# Patient Record
Sex: Female | Born: 1985 | Race: Black or African American | Hispanic: No | Marital: Single | Smoking: Never smoker
Health system: Southern US, Community
[De-identification: ages and names within clinical notes are randomized; demographics above are authoritative.]

## PROBLEM LIST (undated history)

## (undated) DIAGNOSIS — Z889 Allergy status to unspecified drugs, medicaments and biological substances status: Secondary | ICD-10-CM

---
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
## 2014-06-21 ENCOUNTER — Emergency Department (HOSPITAL_BASED_OUTPATIENT_CLINIC_OR_DEPARTMENT_OTHER)
Admission: EM | Admit: 2014-06-21 | Discharge: 2014-06-22 | Disposition: A | Discharge status: Home / Self Care | Payer: Medicaid Other | Attending: Emergency Medicine | Admitting: Emergency Medicine

## 2014-06-21 ENCOUNTER — Encounter (HOSPITAL_BASED_OUTPATIENT_CLINIC_OR_DEPARTMENT_OTHER): Payer: Self-pay | Admitting: Emergency Medicine

## 2014-06-21 DIAGNOSIS — K529 Noninfective gastroenteritis and colitis, unspecified: Secondary | ICD-10-CM

## 2014-06-21 DIAGNOSIS — R112 Nausea with vomiting, unspecified: Secondary | ICD-10-CM | POA: Diagnosis present

## 2014-06-21 LAB — CBC WITH DIFFERENTIAL/PLATELET
BASOS ABS: 0 10*3/uL (ref 0.0–0.1)
Basophils Relative: 0 % (ref 0–1)
EOS ABS: 0.3 10*3/uL (ref 0.0–0.7)
EOS PCT: 4 % (ref 0–5)
HEMATOCRIT: 35.4 % — AB (ref 36.0–46.0)
Hemoglobin: 12.4 g/dL (ref 12.0–15.0)
Lymphocytes Relative: 31 % (ref 12–46)
Lymphs Abs: 2.6 10*3/uL (ref 0.7–4.0)
MCH: 27 pg (ref 26.0–34.0)
MCHC: 35 g/dL (ref 30.0–36.0)
MCV: 77.1 fL — ABNORMAL LOW (ref 78.0–100.0)
Monocytes Absolute: 0.8 10*3/uL (ref 0.1–1.0)
Monocytes Relative: 9 % (ref 3–12)
Neutro Abs: 4.8 10*3/uL (ref 1.7–7.7)
Neutrophils Relative %: 56 % (ref 43–77)
Platelets: 292 10*3/uL (ref 150–400)
RBC: 4.59 MIL/uL (ref 3.87–5.11)
RDW: 14.2 % (ref 11.5–15.5)
WBC: 8.5 10*3/uL (ref 4.0–10.5)

## 2014-06-21 LAB — BASIC METABOLIC PANEL
Anion gap: 5 (ref 5–15)
BUN: 14 mg/dL (ref 6–23)
CALCIUM: 9.2 mg/dL (ref 8.4–10.5)
CO2: 26 mmol/L (ref 19–32)
Chloride: 107 mEq/L (ref 96–112)
Creatinine, Ser: 0.64 mg/dL (ref 0.50–1.10)
GFR calc Af Amer: >90 mL/min (ref >90)
GFR calc non Af Amer: >90 mL/min (ref >90)
GLUCOSE: 95 mg/dL (ref 70–99)
Potassium: 3.8 mmol/L (ref 3.5–5.1)
Sodium: 138 mmol/L (ref 135–145)

## 2014-06-21 MED ORDER — ONDANSETRON 4 MG PO TBDP
ORAL_TABLET | ORAL | Status: AC
  Administered 2014-06-21: 21:00:00
  Filled 2014-06-21: qty 1

## 2014-06-21 MED ORDER — ONDANSETRON 8 MG PO TBDP: ORAL_TABLET | ORAL | Status: DC

## 2014-06-21 MED ORDER — SODIUM CHLORIDE 0.9 % IV SOLN
Freq: Once | INTRAVENOUS | Status: AC
  Administered 2014-06-21: 10 mL/h via INTRAVENOUS

## 2014-06-21 MED ORDER — ONDANSETRON HCL 4 MG/2ML IJ SOLN
4.0000 mg | Freq: Once | INTRAMUSCULAR | Status: AC
  Administered 2014-06-21: 4 mg via INTRAVENOUS
  Filled 2014-06-21: qty 2

## 2014-06-21 NOTE — ED Notes (Signed)
Pt alert, NAD, calm, interactive, skin W&D, resps e/u, no dyspnea noted, no emesis or vomiting noted, steady gait.

## 2014-06-21 NOTE — ED Provider Notes (Signed)
CSN: [id number]     Arrival date & time 06/21/14  2014 History  This chart was scribed for No att. providers found by Modena JanskyAlbert Bradley, ED Scribe. This patient was seen in room MH09/MH09 and the patient's care was started at 10:49 PM.   Chief Complaint  Patient presents with  . Vomiting    HPI Comments: Patient is a 28 year old female who presents with complaints of nausea, vomiting, and headache that started this morning. She denies any bloody emesis. She denies any fevers or chills. She denies any cough, chest pain, or shortness of breath. She denies any ill contacts.  The history is provided by the patient. No language interpreter was used.   HPI Comments: Casey Bradley is a 28 y.o. female who presents to the Emergency Department complaining of vomiting  History reviewed. No pertinent past medical history. History reviewed. No pertinent past surgical history. No family history on file. History  Substance Use Topics  . Smoking status: Never Smoker   . Smokeless tobacco: Not on file  . Alcohol Use: Not on file   OB History    No data available     Review of Systems A complete 10 system review of systems was obtained and all systems are negative except as noted in the HPI and PMH.   Allergies  Review of patient's allergies indicates no known allergies.  Home Medications   Prior to Admission medications   Not on File   BP 122/76 mmHg  Pulse 58  Temp(Src) 98.4 F (36.9 C) (Oral)  Resp 18  Ht 5\' 5"  (1.651 m)  Wt 150 lb (68.04 kg)  BMI 24.96 kg/m2  SpO2 99%  LMP 06/16/2014 Physical Exam  Nursing note and vitals reviewed.  General: Well-developed, well-nourished female in no acute distress; appearance consistent with age of record HENT: normocephalic; atraumatic Eyes: pupils equal, round and reactive to light; extraocular muscles intact Neck: supple Heart: regular rate and rhythm; no murmurs, rubs or gallops Lungs: clear to auscultation bilaterally Abdomen: soft;  nondistended; nontender; no masses or hepatosplenomegaly; bowel sounds present Extremities: No deformity; full range of motion; pulses normal Neurologic: Awake, alert and oriented; motor function intact in all extremities and symmetric; no facial droop Skin: Warm and dry Psychiatric: Normal mood and affect  ED Course  Procedures (including critical care time) DIAGNOSTIC STUDIES: Oxygen Saturation is 99% on RA, normal by my interpretation.    COORDINATION OF CARE: 10:49 PM- Pt advised of plan for treatment and pt agrees.  Labs Review Labs Reviewed  CBC WITH DIFFERENTIAL - Abnormal; Notable for the following:    HCT 35.4 (*)    MCV 77.1 (*)    All other components within normal limits  BASIC METABOLIC PANEL    Imaging Review No results found.   EKG Interpretation None      MDM   Final diagnoses:  None    Her presentation, exam, and workup are consistent with a viral gastroenteritis. She is given anti-medics and fluids in the ER and is feeling better. She will be discharged to home, to return as needed for any problems.  I personally performed the services described in this documentation, which was scribed in my presence. The recorded information has been reviewed and is accurate.       Casey Lyonsouglas Mirielle Byrum, MD 06/22/14 [phone]

## 2014-06-21 NOTE — ED Notes (Signed)
Pt c/o "can taste cold", also reports nv & HA, no relief with ODT zofran in triage, (denies: fever, sob, cough, diarrhea, sore throat or dizziness), nv onset this morning, have also developed HA.

## 2014-06-21 NOTE — Discharge Instructions (Signed)
Zofran as prescribed as needed for nausea.  Follow-up with your primary Dr. if not improving in the next 2-3 days, and return to the ER if your symptoms substantially worsen or change.   Viral Gastroenteritis Viral gastroenteritis is also known as stomach flu. This condition affects the stomach and intestinal tract. It can cause sudden diarrhea and vomiting. The illness typically lasts 3 to 8 days. Most people develop an immune response that eventually gets rid of the virus. While this natural response develops, the virus can make you quite ill. CAUSES  Many different viruses can cause gastroenteritis, such as rotavirus or noroviruses. You can catch one of these viruses by consuming contaminated food or water. You may also catch a virus by sharing utensils or other personal items with an infected person or by touching a contaminated surface. SYMPTOMS  The most common symptoms are diarrhea and vomiting. These problems can cause a severe loss of body fluids (dehydration) and a body salt (electrolyte) imbalance. Other symptoms may include:  Fever.  Headache.  Fatigue.  Abdominal pain. DIAGNOSIS  Your caregiver can usually diagnose viral gastroenteritis based on your symptoms and a physical exam. A stool sample may also be taken to test for the presence of viruses or other infections. TREATMENT  This illness typically goes away on its own. Treatments are aimed at rehydration. The most serious cases of viral gastroenteritis involve vomiting so severely that you are not able to keep fluids down. In these cases, fluids must be given through an intravenous line (IV). HOME CARE INSTRUCTIONS   Drink enough fluids to keep your urine clear or pale yellow. Drink small amounts of fluids frequently and increase the amounts as tolerated.  Ask your caregiver for specific rehydration instructions.  Avoid:  Foods high in sugar.  Alcohol.  Carbonated drinks.  Tobacco.  Juice.  Caffeine  drinks.  Extremely hot or cold fluids.  Fatty, greasy foods.  Too much intake of anything at one time.  Dairy products until 24 to 48 hours after diarrhea stops.  You may consume probiotics. Probiotics are active cultures of beneficial bacteria. They may lessen the amount and number of diarrheal stools in adults. Probiotics can be found in yogurt with active cultures and in supplements.  Wash your hands well to avoid spreading the virus.  Only take over-the-counter or prescription medicines for pain, discomfort, or fever as directed by your caregiver. Do not give aspirin to children. Antidiarrheal medicines are not recommended.  Ask your caregiver if you should continue to take your regular prescribed and over-the-counter medicines.  Keep all follow-up appointments as directed by your caregiver. SEEK IMMEDIATE MEDICAL CARE IF:   You are unable to keep fluids down.  You do not urinate at least once every 6 to 8 hours.  You develop shortness of breath.  You notice blood in your stool or vomit. This may look like coffee grounds.  You have abdominal pain that increases or is concentrated in one small area (localized).  You have persistent vomiting or diarrhea.  You have a fever.  The patient is a child younger than 3 months, and he or she has a fever.  The patient is a child older than 3 months, and he or she has a fever and persistent symptoms.  The patient is a child older than 3 months, and he or she has a fever and symptoms suddenly get worse.  The patient is a baby, and he or she has no tears when crying. MAKE SURE  YOU:   Understand these instructions.  Will watch your condition.  Will get help right away if you are not doing well or get worse. Document Released: 06/13/2005 Document Revised: 09/05/2011 Document Reviewed: 03/30/2011 [facility] Patient Information 2015 [place], [place]. This information is not intended to replace advice given to you by your health care  provider. Make sure you discuss any questions you have with your health care provider.

## 2014-06-21 NOTE — ED Notes (Signed)
Pt presents to ED with complaints of vomiting that started today. No vomiting in triage.

## 2014-06-22 NOTE — ED Notes (Signed)
I removed i.v. Per nurse request. Patient wants a "doctor note" stated she "left work to come here, needs note".

## 2015-09-07 ENCOUNTER — Emergency Department (HOSPITAL_BASED_OUTPATIENT_CLINIC_OR_DEPARTMENT_OTHER)
Admission: EM | Admit: 2015-09-07 | Discharge: 2015-09-07 | Disposition: A | Discharge status: Home / Self Care | Payer: Medicaid Other | Attending: Emergency Medicine | Admitting: Emergency Medicine

## 2015-09-07 ENCOUNTER — Encounter (HOSPITAL_BASED_OUTPATIENT_CLINIC_OR_DEPARTMENT_OTHER): Payer: Self-pay | Admitting: *Deleted

## 2015-09-07 DIAGNOSIS — R51 Headache: Secondary | ICD-10-CM | POA: Diagnosis present

## 2015-09-07 DIAGNOSIS — J0100 Acute maxillary sinusitis, unspecified: Secondary | ICD-10-CM | POA: Diagnosis not present

## 2015-09-07 HISTORY — DX: Allergy status to unspecified drugs, medicaments and biological substances: Z88.9

## 2015-09-07 MED ORDER — AZITHROMYCIN 250 MG PO TABS: 250.0000 mg | ORAL_TABLET | Freq: Every day | ORAL | Status: DC

## 2015-09-07 NOTE — ED Notes (Signed)
One week of facial pain, sneezing, congestion, denies fever

## 2015-09-07 NOTE — ED Provider Notes (Signed)
CSN: [id number]     Arrival date & time 09/07/15  1051 History   First MD Initiated Contact with Patient 09/07/15 1311     Chief Complaint  Patient presents with  . Facial Pain     (Consider location/radiation/quality/duration/timing/severity/associated sxs/prior Treatment) HPI Comments: Patient presents today with facial pain.  Pain located over the maxillary sinuses.  Pain worse with leaning forward.  Pain has been present for the past week and is gradually worsening.  She reports that the pain feels similar to pain that she has had with Acute Sinusitis in the past.  She has not taken anything for symptoms prior to arrival.  Pain associated with nasal congestion.  She denies fever, chills, cough, SOB, ear pain, or sore throat.    The history is provided by the patient.    Past Medical History  Diagnosis Date  . Multiple allergies    History reviewed. No pertinent past surgical history. No family history on file. Social History  Substance Use Topics  . Smoking status: Never Smoker   . Smokeless tobacco: Never Used  . Alcohol Use: Yes     Comment: occasional   OB History    No data available     Review of Systems  All other systems reviewed and are negative.     Allergies  Review of patient's allergies indicates no known allergies.  Home Medications   Prior to Admission medications   Medication Sig Start Date End Date Taking? Authorizing Provider  ondansetron (ZOFRAN ODT) 8 MG disintegrating tablet 8mg  ODT q4 hours prn nausea 06/21/14   Geoffery Lyonsouglas Delo, MD   BP 119/92 mmHg  Pulse 93  Temp(Src) 98.6 F (37 C) (Oral)  Resp 18  Ht 5\' 5"  (1.651 m)  Wt 71.169 kg  BMI 26.11 kg/m2  SpO2 100%  LMP 08/26/2015 Physical Exam  Constitutional: She appears well-developed and well-nourished.  HENT:  Head: Normocephalic and atraumatic.  Right Ear: Tympanic membrane and ear canal normal.  Left Ear: Tympanic membrane and ear canal normal.  Nose: Right sinus exhibits  maxillary sinus tenderness. Left sinus exhibits maxillary sinus tenderness.  Neck: Normal range of motion. Neck supple.  Cardiovascular: Normal rate, regular rhythm and normal heart sounds.   Pulmonary/Chest: Effort normal and breath sounds normal. No respiratory distress. She has no wheezes. She has no rales.  Musculoskeletal: Normal range of motion.  Neurological: She is alert.  Skin: Skin is warm and dry.  Psychiatric: She has a normal mood and affect.  Nursing note and vitals reviewed.   ED Course  Procedures (including critical care time) Labs Review Labs Reviewed - No data to display  Imaging Review No results found. I have personally reviewed and evaluated these images and lab results as part of my medical decision-making.   EKG Interpretation None      MDM   Final diagnoses:  None   Patient with symptoms consistent with Acute Sinusitis.  Patient given rx for antibiotics.  Stable for discharge.  Return precautions given.    Santiago GladHeather Adasha Boehme, PA-C 09/07/15 1631  Geoffery Lyonsouglas Delo, MD 09/08/15 [phone]

## 2019-03-14 ENCOUNTER — Encounter (HOSPITAL_BASED_OUTPATIENT_CLINIC_OR_DEPARTMENT_OTHER): Payer: Self-pay | Admitting: *Deleted

## 2019-03-14 ENCOUNTER — Emergency Department (HOSPITAL_BASED_OUTPATIENT_CLINIC_OR_DEPARTMENT_OTHER)
Admission: EM | Admit: 2019-03-14 | Discharge: 2019-03-14 | Disposition: A | Discharge status: Home / Self Care | Payer: Medicaid Other | Attending: Emergency Medicine | Admitting: Emergency Medicine

## 2019-03-14 ENCOUNTER — Other Ambulatory Visit: Payer: Self-pay

## 2019-03-14 DIAGNOSIS — R300 Dysuria: Secondary | ICD-10-CM | POA: Diagnosis present

## 2019-03-14 DIAGNOSIS — Z91013 Allergy to seafood: Secondary | ICD-10-CM | POA: Diagnosis not present

## 2019-03-14 DIAGNOSIS — N390 Urinary tract infection, site not specified: Secondary | ICD-10-CM | POA: Diagnosis not present

## 2019-03-14 LAB — URINALYSIS, ROUTINE W REFLEX MICROSCOPIC
Bilirubin Urine: NEGATIVE
Glucose, UA: NEGATIVE mg/dL
Ketones, ur: NEGATIVE mg/dL
Nitrite: NEGATIVE
Protein, ur: 30 mg/dL — AB
Specific Gravity, Urine: 1.025 (ref 1.005–1.030)
pH: 6 (ref 5.0–8.0)

## 2019-03-14 LAB — URINALYSIS, MICROSCOPIC (REFLEX)
RBC / HPF: >50 RBC/hpf (ref 0–5)
WBC, UA: >50 WBC/hpf (ref 0–5)

## 2019-03-14 LAB — PREGNANCY, URINE: Preg Test, Ur: NEGATIVE

## 2019-03-14 MED ORDER — CEPHALEXIN 500 MG PO CAPS: 500.0000 mg | ORAL_CAPSULE | Freq: Four times a day (QID) | ORAL | 0 refills | Status: AC

## 2019-03-14 MED ORDER — PHENAZOPYRIDINE HCL 200 MG PO TABS: 200.0000 mg | ORAL_TABLET | Freq: Three times a day (TID) | ORAL | 0 refills | Status: AC | PRN

## 2019-03-14 NOTE — Discharge Instructions (Signed)
Stay very well hydrated with plenty of water throughout the day. Please take antibiotic until completion. Use pyridium as directed to decrease painful urination but know that a common side effect is to turn your urine a bright orange/red color. This is not a harmful side effect. Follow up with primary care physician in 1 week for recheck of ongoing symptoms.  Please seek immediate care if you develop the following: Your symptoms are no better or worse in 3 days. There is severe back pain or lower abdominal pain.  You develop a fever There is nausea or vomiting.  There is continued burning or discomfort with urination after antibiotics are completed. New or worsening symptoms develop or you have any additional concerns.

## 2019-03-14 NOTE — ED Provider Notes (Signed)
[facility] EMERGENCY DEPARTMENT Provider Note   CSN: [id number] Arrival date & time: 03/14/19  1650     History   Chief Complaint Chief Complaint  Patient presents with  . Dysuria    HPI Casey Bradley is a 33 y.o. female.     The history is provided by the patient and medical records. No language interpreter was used.  Dysuria Associated symptoms: abdominal pain   Associated symptoms: no nausea, no vaginal discharge and no vomiting    Casey Bradley is a 33 y.o. female who presents to the Emergency Department complaining of dysuria and suprapubic abdominal pain which began 2 or 3 days ago.  Feels similar to previous UTIs that she has had in the past.  Denies fever, nausea, vomiting, flank pain or back pain.  No vaginal discharge.  She is sexually active, but uses protection and denies concerns for STIs.  Does not want STI testing today.  She is on her menstrual cycle.  She has tried increasing fluids as well as cranberry pills with little improvement.  Past Medical History:  Diagnosis Date  . Multiple allergies     There are no active problems to display for this patient.   History reviewed. No pertinent surgical history.   OB History   No obstetric history on file.      Home Medications    Prior to Admission medications   Medication Sig Start Date End Date Taking? Authorizing Provider  cephALEXin (KEFLEX) 500 MG capsule Take 1 capsule (500 mg total) by mouth 4 (four) times daily. 03/14/19   Casey Bradley, Casey PicketJaime Pilcher, PA-C  phenazopyridine (PYRIDIUM) 200 MG tablet Take 1 tablet (200 mg total) by mouth 3 (three) times daily as needed for pain. 03/14/19   Casey Bradley, Casey PicketJaime Pilcher, PA-C    Family History No family history on file.  Social History Social History   Tobacco Use  . Smoking status: Never Smoker  . Smokeless tobacco: Never Used  Substance Use Topics  . Alcohol use: Yes    Comment: occasional  . Drug use: No     Allergies   Shellfish  allergy   Review of Systems Review of Systems  Gastrointestinal: Positive for abdominal pain. Negative for diarrhea, nausea and vomiting.  Genitourinary: Positive for dysuria. Negative for vaginal discharge.  All other systems reviewed and are negative.    Physical Exam Updated Vital Signs BP (!) 136/100 (BP Location: Left Arm)   Pulse 89   Temp 98.8 F (37.1 C)   Resp 16   Ht 5\' 5"  (1.651 m)   Wt 86.2 kg   LMP 03/06/2019   SpO2 100%   BMI 31.62 kg/m   Physical Exam Vitals signs and nursing note reviewed.  Constitutional:      General: She is not in acute distress.    Appearance: She is well-developed.     Comments: Well-appearing.  HENT:     Head: Normocephalic and atraumatic.  Neck:     Musculoskeletal: Neck supple.  Cardiovascular:     Rate and Rhythm: Normal rate and regular rhythm.     Heart sounds: Normal heart sounds. No murmur.  Pulmonary:     Effort: Pulmonary effort is normal. No respiratory distress.     Breath sounds: Normal breath sounds.  Abdominal:     General: There is no distension.     Palpations: Abdomen is soft.     Comments: No abdominal, flank or CVA tenderness.  Skin:    General: Skin is warm  and dry.  Neurological:     Mental Status: She is alert and oriented to person, place, and time.      ED Treatments / Results  Labs (all labs ordered are listed, but only abnormal results are displayed) Labs Reviewed  URINALYSIS, ROUTINE W REFLEX MICROSCOPIC - Abnormal; Notable for the following components:      Result Value   APPearance CLOUDY (*)    Hgb urine dipstick LARGE (*)    Protein, ur 30 (*)    Leukocytes,Ua MODERATE (*)    All other components within normal limits  URINALYSIS, MICROSCOPIC (REFLEX) - Abnormal; Notable for the following components:   Bacteria, UA FEW (*)    All other components within normal limits  PREGNANCY, URINE    EKG None  Radiology No results found.  Procedures Procedures (including critical care  time)  Medications Ordered in ED Medications - No data to display   Initial Impression / Assessment and Plan / ED Course  I have reviewed the triage vital signs and the nursing notes.  Pertinent labs & imaging results that were available during my care of the patient were reviewed by me and considered in my medical decision making (see chart for details).       Casey Bradley is a 33 y.o. female who presents to ED for dysuria and suprapubic abdominal pain which feels similar to her previous UTIs.  On exam, she is afebrile, hemodynamically stable with no abdominal, flank or CVA tenderness.  She denies vaginal discharge and declines STI testing today.  Urinalysis with moderate leukocytes and > 50 WBCs consistent with UTI.  She does have hemoglobin noted in her urine, but is on her menstrual cycle.  Will treat with Keflex and Pyridium.  PCP follow-up.  Understands reasons to return to the emergency department including fever, vomiting, worsening abdominal pain, back pain or any other concerns.  All questions answered.    Final Clinical Impressions(s) / ED Diagnoses   Final diagnoses:  Lower urinary tract infectious disease    ED Discharge Orders         Ordered    cephALEXin (KEFLEX) 500 MG capsule  4 times daily     03/14/19 1748    phenazopyridine (PYRIDIUM) 200 MG tablet  3 times daily PRN     03/14/19 1748           Casey Bradley, Casey Almond, PA-C 03/14/19 1810    Quintella Reichert, MD 03/14/19 2356

## 2019-03-14 NOTE — ED Triage Notes (Signed)
C/o dysuria x 3 days  

## 2020-02-01 ENCOUNTER — Emergency Department (HOSPITAL_BASED_OUTPATIENT_CLINIC_OR_DEPARTMENT_OTHER)
Admission: EM | Admit: 2020-02-01 | Discharge: 2020-02-02 | Disposition: A | Discharge status: Home / Self Care | Payer: Medicaid Other | Attending: Emergency Medicine | Admitting: Emergency Medicine

## 2020-02-01 ENCOUNTER — Encounter (HOSPITAL_BASED_OUTPATIENT_CLINIC_OR_DEPARTMENT_OTHER): Payer: Self-pay | Admitting: *Deleted

## 2020-02-01 ENCOUNTER — Emergency Department (HOSPITAL_BASED_OUTPATIENT_CLINIC_OR_DEPARTMENT_OTHER): Payer: Medicaid Other

## 2020-02-01 ENCOUNTER — Other Ambulatory Visit: Payer: Self-pay

## 2020-02-01 DIAGNOSIS — R109 Unspecified abdominal pain: Secondary | ICD-10-CM | POA: Diagnosis not present

## 2020-02-01 DIAGNOSIS — U071 COVID-19: Secondary | ICD-10-CM | POA: Insufficient documentation

## 2020-02-01 DIAGNOSIS — M549 Dorsalgia, unspecified: Secondary | ICD-10-CM | POA: Diagnosis present

## 2020-02-01 DIAGNOSIS — J1282 Pneumonia due to coronavirus disease 2019: Secondary | ICD-10-CM

## 2020-02-01 LAB — URINALYSIS, MICROSCOPIC (REFLEX)

## 2020-02-01 LAB — CBC WITH DIFFERENTIAL/PLATELET
Abs Immature Granulocytes: 0.33 10*3/uL — ABNORMAL HIGH (ref 0.00–0.07)
Basophils Absolute: 0 10*3/uL (ref 0.0–0.1)
Basophils Relative: 0 %
Eosinophils Absolute: 0 10*3/uL (ref 0.0–0.5)
Eosinophils Relative: 0 %
HCT: 33.7 % — ABNORMAL LOW (ref 36.0–46.0)
Hemoglobin: 11.4 g/dL — ABNORMAL LOW (ref 12.0–15.0)
Immature Granulocytes: 2 %
Lymphocytes Relative: 7 %
Lymphs Abs: 1.2 10*3/uL (ref 0.7–4.0)
MCH: 24.4 pg — ABNORMAL LOW (ref 26.0–34.0)
MCHC: 33.8 g/dL (ref 30.0–36.0)
MCV: 72.2 fL — ABNORMAL LOW (ref 80.0–100.0)
Monocytes Absolute: 1.4 10*3/uL — ABNORMAL HIGH (ref 0.1–1.0)
Monocytes Relative: 9 %
Neutro Abs: 13.6 10*3/uL — ABNORMAL HIGH (ref 1.7–7.7)
Neutrophils Relative %: 82 %
Platelets: 469 10*3/uL — ABNORMAL HIGH (ref 150–400)
RBC: 4.67 MIL/uL (ref 3.87–5.11)
RDW: 16.9 % — ABNORMAL HIGH (ref 11.5–15.5)
WBC: 16.6 10*3/uL — ABNORMAL HIGH (ref 4.0–10.5)
nRBC: 0 % (ref 0.0–0.2)

## 2020-02-01 LAB — URINALYSIS, ROUTINE W REFLEX MICROSCOPIC
Glucose, UA: NEGATIVE mg/dL
Hgb urine dipstick: NEGATIVE
Ketones, ur: NEGATIVE mg/dL
Leukocytes,Ua: NEGATIVE
Nitrite: NEGATIVE
Protein, ur: 100 mg/dL — AB
Specific Gravity, Urine: >1.03 — ABNORMAL HIGH (ref 1.005–1.030)
pH: 5.5 (ref 5.0–8.0)

## 2020-02-01 LAB — COMPREHENSIVE METABOLIC PANEL
ALT: 43 U/L (ref 0–44)
AST: 30 U/L (ref 15–41)
Albumin: 2.7 g/dL — ABNORMAL LOW (ref 3.5–5.0)
Alkaline Phosphatase: 32 U/L — ABNORMAL LOW (ref 38–126)
Anion gap: 10 (ref 5–15)
BUN: 9 mg/dL (ref 6–20)
CO2: 26 mmol/L (ref 22–32)
Calcium: 8.4 mg/dL — ABNORMAL LOW (ref 8.9–10.3)
Chloride: 99 mmol/L (ref 98–111)
Creatinine, Ser: 0.67 mg/dL (ref 0.44–1.00)
GFR calc Af Amer: >60 mL/min (ref >60)
GFR calc non Af Amer: >60 mL/min (ref >60)
Glucose, Bld: 132 mg/dL — ABNORMAL HIGH (ref 70–99)
Potassium: 3.3 mmol/L — ABNORMAL LOW (ref 3.5–5.1)
Sodium: 135 mmol/L (ref 135–145)
Total Bilirubin: 0.8 mg/dL (ref 0.3–1.2)
Total Protein: 7.8 g/dL (ref 6.5–8.1)

## 2020-02-01 LAB — PREGNANCY, URINE: Preg Test, Ur: NEGATIVE

## 2020-02-01 MED ORDER — HYDROMORPHONE HCL 1 MG/ML IJ SOLN: 1.0000 mg | Freq: Once | INTRAMUSCULAR | Status: DC

## 2020-02-01 MED ORDER — SODIUM CHLORIDE 0.9 % IV SOLN
INTRAVENOUS | Status: DC | PRN
  Administered 2020-02-01: 1000 mL via INTRAVENOUS

## 2020-02-01 MED ORDER — LEVOFLOXACIN 750 MG PO TABS: 750.0000 mg | ORAL_TABLET | Freq: Every day | ORAL | 0 refills | Status: AC

## 2020-02-01 MED ORDER — LEVOFLOXACIN IN D5W 750 MG/150ML IV SOLN
750.0000 mg | Freq: Once | INTRAVENOUS | Status: AC
  Administered 2020-02-01: 750 mg via INTRAVENOUS
  Filled 2020-02-01: qty 150

## 2020-02-01 MED ORDER — SODIUM CHLORIDE 0.9 % IV BOLUS
1000.0000 mL | Freq: Once | INTRAVENOUS | Status: AC
  Administered 2020-02-01: 1000 mL via INTRAVENOUS

## 2020-02-01 MED ORDER — MORPHINE SULFATE (PF) 4 MG/ML IV SOLN
4.0000 mg | Freq: Once | INTRAVENOUS | Status: AC
  Administered 2020-02-01: 4 mg via INTRAVENOUS
  Filled 2020-02-01: qty 1

## 2020-02-01 NOTE — ED Notes (Signed)
Temperature deferred until patient in a private treatment room due to covid status.

## 2020-02-01 NOTE — ED Provider Notes (Signed)
[facility] EMERGENCY DEPARTMENT Provider Note   CSN: [id number] Arrival date & time: 02/01/20  1954     History Chief Complaint  Patient presents with  . Back Pain    Lianny Lippens is a 34 y.o. female who presented with right flank pain.  Patient states that she has right flank right side pain since yesterday.  She denies trauma or injury.  She states that she is on her menstrual cycle and has some blood in her urine.  Denies any pain when she urinates or fever or vomiting.  Patient took some Tylenol with minimal relief.  Denies any history of kidney stones.  The history is provided by the patient.       Past Medical History:  Diagnosis Date  . Multiple allergies     There are no problems to display for this patient.   History reviewed. No pertinent surgical history.   OB History   No obstetric history on file.     No family history on file.  Social History   Tobacco Use  . Smoking status: Never Smoker  . Smokeless tobacco: Never Used  Vaping Use  . Vaping Use: Never used  Substance Use Topics  . Alcohol use: Not Currently    Comment: occasional  . Drug use: No    Home Medications Prior to Admission medications   Medication Sig Start Date End Date Taking? Authorizing Provider  cephALEXin (KEFLEX) 500 MG capsule Take 1 capsule (500 mg total) by mouth 4 (four) times daily. 03/14/19   Ward, Chase Picket, PA-C  phenazopyridine (PYRIDIUM) 200 MG tablet Take 1 tablet (200 mg total) by mouth 3 (three) times daily as needed for pain. 03/14/19   Ward, Chase Picket, PA-C    Allergies    Shellfish allergy  Review of Systems   Review of Systems  Musculoskeletal: Positive for back pain.  All other systems reviewed and are negative.   Physical Exam Updated Vital Signs BP 99/76 (BP Location: Right Arm)   Pulse (!) 118   Temp 98.2 F (36.8 C) (Oral)   Resp 18   Ht 5\' 5"  (1.651 m)   Wt 94.3 kg   LMP 01/22/2020   SpO2 96%   BMI 34.61 kg/m    Physical Exam Vitals and nursing note reviewed.  Constitutional:      Comments: Uncomfortable   HENT:     Head: Normocephalic.     Nose: Nose normal.     Mouth/Throat:     Mouth: Mucous membranes are dry.  Eyes:     Extraocular Movements: Extraocular movements intact.     Pupils: Pupils are equal, round, and reactive to light.  Cardiovascular:     Rate and Rhythm: Regular rhythm. Tachycardia present.     Pulses: Normal pulses.     Heart sounds: Normal heart sounds.  Pulmonary:     Effort: Pulmonary effort is normal.     Breath sounds: Normal breath sounds.  Abdominal:     General: Abdomen is flat.     Palpations: Abdomen is soft.     Comments: + R CVAT   Musculoskeletal:        General: Normal range of motion.     Cervical back: Normal range of motion.     Comments: Right CVA tenderness there is no midline spinal tenderness.  No saddle anesthesia.  Skin:    General: Skin is warm.     Capillary Refill: Capillary refill takes less than 2 seconds.  Neurological:  General: No focal deficit present.     Mental Status: She is oriented to person, place, and time.  Psychiatric:        Mood and Affect: Mood normal.        Behavior: Behavior normal.     ED Results / Procedures / Treatments   Labs (all labs ordered are listed, but only abnormal results are displayed) Labs Reviewed  URINALYSIS, ROUTINE W REFLEX MICROSCOPIC - Abnormal; Notable for the following components:      Result Value   Color, Urine AMBER (*)    APPearance HAZY (*)    Specific Gravity, Urine >1.030 (*)    Bilirubin Urine SMALL (*)    Protein, ur 100 (*)    All other components within normal limits  CBC WITH DIFFERENTIAL/PLATELET - Abnormal; Notable for the following components:   WBC 16.6 (*)    Hemoglobin 11.4 (*)    HCT 33.7 (*)    MCV 72.2 (*)    MCH 24.4 (*)    RDW 16.9 (*)    Platelets 469 (*)    Neutro Abs 13.6 (*)    Monocytes Absolute 1.4 (*)    Abs Immature Granulocytes 0.33 (*)     All other components within normal limits  URINALYSIS, MICROSCOPIC (REFLEX) - Abnormal; Notable for the following components:   Bacteria, UA MANY (*)    All other components within normal limits  PREGNANCY, URINE  COMPREHENSIVE METABOLIC PANEL    EKG None  Radiology No results found.  Procedures Procedures (including critical care time)  Medications Ordered in ED Medications  sodium chloride 0.9 % bolus 1,000 mL (has no administration in time range)  morphine 4 MG/ML injection 4 mg (has no administration in time range)    ED Course  I have reviewed the triage vital signs and the nursing notes.  Pertinent labs & imaging results that were available during my care of the patient were reviewed by me and considered in my medical decision making (see chart for details).    MDM Rules/Calculators/A&P                          Phinley Petkus is a 34 y.o. female presented with right flank pain.  Likely renal colic versus muscle spasms.  Plan to get CBC, CMP, UA, pregnancy test, CT renal stone.  Will hydrate and give pain medicine and reassess.  9 pm CT showed bilateral pneumonia.  Patient has no cough or shortness of breath or fever.  She now tells me that she was diagnosed with Covid about a week ago.  She did not know how she got Covid.  However cell count is 16.  Will give Levaquin.  Patient is not currently hypoxic.  Her tachycardia improved with IV fluids and pain medicine.  At this time, I do not think she needs to be admitted for Covid pneumonia.  Her CT showed no kidney stone or intra-abdominal process.  I think her abdominal pain is likely secondary to her Covid.  Will order Levaquin for pneumonia.   11:38 PM Not hypoxic. Stable for discharge. Told her to stay home     Final Clinical Impression(s) / ED Diagnoses Final diagnoses:  None    Rx / DC Orders ED Discharge Orders    None       Charlynne Pander, MD 02/01/20 2338

## 2020-02-01 NOTE — ED Triage Notes (Signed)
Pt reports mid back pain since 0300 today. She took tylenol with some relief. Denies known injury

## 2020-02-01 NOTE — Discharge Instructions (Signed)
You have pneumonia from Covid.  Take Levaquin as prescribed.  Please stay home for several more days.  See your doctor for follow-up.  Return to ER if you have trouble breathing, worse cough, severe flank pain.      Person Under Monitoring Name: Casey Bradley  Location: [address] [place] [address]   Infection Prevention Recommendations for Individuals Confirmed to have, or Being Evaluated for, 2019 Novel Coronavirus (COVID-19) Infection Who Receive Care at Home  Individuals who are confirmed to have, or are being evaluated for, COVID-19 should follow the prevention steps below until a healthcare provider or local or state health department says they can return to normal activities.  Stay home except to get medical care You should restrict activities outside your home, except for getting medical care. Do not go to work, school, or public areas, and do not use public transportation or taxis.  Call ahead before visiting your doctor Before your medical appointment, call the healthcare provider and tell them that you have, or are being evaluated for, COVID-19 infection. This will help the healthcare provider's office take steps to keep other people from getting infected. Ask your healthcare provider to call the local or state health department.  Monitor your symptoms Seek prompt medical attention if your illness is worsening (e.g., difficulty breathing). Before going to your medical appointment, call the healthcare provider and tell them that you have, or are being evaluated for, COVID-19 infection. Ask your healthcare provider to call the local or state health department.  Wear a facemask You should wear a facemask that covers your nose and mouth when you are in the same room with other people and when you visit a healthcare provider. People who live with or visit you should also wear a facemask while they are in the same room with you.  Separate yourself from  other people in your home As much as possible, you should stay in a different room from other people in your home. Also, you should use a separate bathroom, if available.  Avoid sharing household items You should not share dishes, drinking glasses, cups, eating utensils, towels, bedding, or other items with other people in your home. After using these items, you should wash them thoroughly with soap and water.  Cover your coughs and sneezes Cover your mouth and nose with a tissue when you cough or sneeze, or you can cough or sneeze into your sleeve. Throw used tissues in a lined trash can, and immediately wash your hands with soap and water for at least 20 seconds or use an alcohol-based hand rub.  Wash your [employer] your hands often and thoroughly with soap and water for at least 20 seconds. You can use an alcohol-based hand sanitizer if soap and water are not available and if your hands are not visibly dirty. Avoid touching your eyes, nose, and mouth with unwashed hands.   Prevention Steps for Caregivers and Household Members of Individuals Confirmed to have, or Being Evaluated for, COVID-19 Infection Being Cared for in the Home  If you live with, or provide care at home for, a person confirmed to have, or being evaluated for, COVID-19 infection please follow these guidelines to prevent infection:  Follow healthcare provider's instructions Make sure that you understand and can help the patient follow any healthcare provider instructions for all care.  Provide for the patient's basic needs You should help the patient with basic needs in the home and provide support for getting  groceries, prescriptions, and other personal needs.  Monitor the patient's symptoms If they are getting sicker, call his or her medical provider and tell them that the patient has, or is being evaluated for, COVID-19 infection. This will help the healthcare provider's office take steps to keep other people  from getting infected. Ask the healthcare provider to call the local or state health department.  Limit the number of people who have contact with the patient If possible, have only one caregiver for the patient. Other household members should stay in another home or place of residence. If this is not possible, they should stay in another room, or be separated from the patient as much as possible. Use a separate bathroom, if available. Restrict visitors who do not have an essential need to be in the home.  Keep older adults, very young children, and other sick people away from the patient Keep older adults, very young children, and those who have compromised immune systems or chronic health conditions away from the patient. This includes people with chronic heart, lung, or kidney conditions, diabetes, and cancer.  Ensure good ventilation Make sure that shared spaces in the home have good air flow, such as from an air conditioner or an opened window, weather permitting.  Wash your hands often Wash your hands often and thoroughly with soap and water for at least 20 seconds. You can use an alcohol based hand sanitizer if soap and water are not available and if your hands are not visibly dirty. Avoid touching your eyes, nose, and mouth with unwashed hands. Use disposable paper towels to dry your hands. If not available, use dedicated cloth towels and replace them when they become wet.  Wear a facemask and gloves Wear a disposable facemask at all times in the room and gloves when you touch or have contact with the patient's blood, body fluids, and/or secretions or excretions, such as sweat, saliva, sputum, nasal mucus, vomit, urine, or feces.  Ensure the mask fits over your nose and mouth tightly, and do not touch it during use. Throw out disposable facemasks and gloves after using them. Do not reuse. Wash your hands immediately after removing your facemask and gloves. If your personal clothing  becomes contaminated, carefully remove clothing and launder. Wash your hands after handling contaminated clothing. Place all used disposable facemasks, gloves, and other waste in a lined container before disposing them with other household waste. Remove gloves and wash your hands immediately after handling these items.  Do not share dishes, glasses, or other household items with the patient Avoid sharing household items. You should not share dishes, drinking glasses, cups, eating utensils, towels, bedding, or other items with a patient who is confirmed to have, or being evaluated for, COVID-19 infection. After the person uses these items, you should wash them thoroughly with soap and water.  Wash laundry thoroughly Immediately remove and wash clothes or bedding that have blood, body fluids, and/or secretions or excretions, such as sweat, saliva, sputum, nasal mucus, vomit, urine, or feces, on them. Wear gloves when handling laundry from the patient. Read and follow directions on labels of laundry or clothing items and detergent. In general, wash and dry with the warmest temperatures recommended on the label.  Clean all areas the individual has used often Clean all touchable surfaces, such as counters, tabletops, doorknobs, bathroom fixtures, toilets, phones, keyboards, tablets, and bedside tables, every day. Also, clean any surfaces that may have blood, body fluids, and/or secretions or excretions on them. Wear  gloves when cleaning surfaces the patient has come in contact with. Use a diluted bleach solution (e.g., dilute bleach with 1 part bleach and 10 parts water) or a household disinfectant with a label that says EPA-registered for coronaviruses. To make a bleach solution at home, add 1 tablespoon of bleach to 1 quart (4 cups) of water. For a larger supply, add  cup of bleach to 1 gallon (16 cups) of water. Read labels of cleaning products and follow recommendations provided on product labels.  Labels contain instructions for safe and effective use of the cleaning product including precautions you should take when applying the product, such as wearing gloves or eye protection and making sure you have good ventilation during use of the product. Remove gloves and wash hands immediately after cleaning.  Monitor yourself for signs and symptoms of illness Caregivers and household members are considered close contacts, should monitor their health, and will be asked to limit movement outside of the home to the extent possible. Follow the monitoring steps for close contacts listed on the symptom monitoring form.   ? If you have additional questions, contact your local health department or call the epidemiologist on call at [phone] (available 24/7). ? This guidance is subject to change. For the most up-to-date guidance from [facility], please refer to their website: [email or web address] Not really no now

## 2020-02-02 NOTE — ED Notes (Signed)
AVS reviewed with client and also pt teaching done re: Rx by EDP for pt to take at home, pt teaching done regards to utilizing Ibuprofen and Tylenol to aid in pain control along with correct positioning when lying in bed and the use of warm showers as well. Reinforced wearing mask, with strict handwashing and trying to isolate due to being COVID positive.

## 2022-01-31 IMAGING — CT CT RENAL STONE PROTOCOL
2 of 4 series · 16 of 46 positions shown, 18 images · non-contrast
Comparison: None.

CLINICAL DATA: Right-sided flank

EXAM:
CT ABDOMEN AND PELVIS WITHOUT CONTRAST
TECHNIQUE: Multidetector CT imaging of the abdomen and pelvis was performed
following the standard protocol without IV contrast.

[Series 2: axial st · axial · 0.68mm/px · z∈[-276,+134]mm · 13 of 93 slices shown, 15 images]
[im 7/93  soft-tissue]
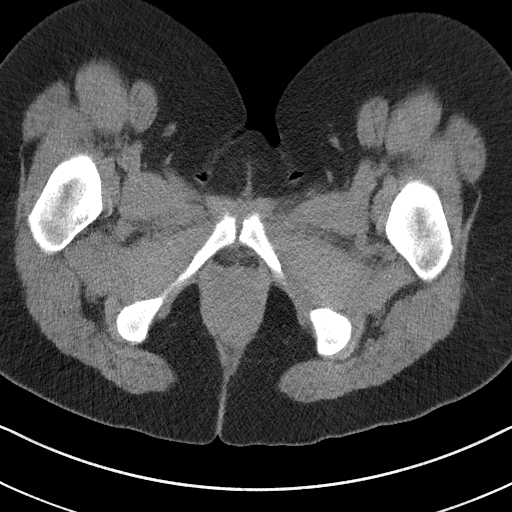
[im 7/93  bone]
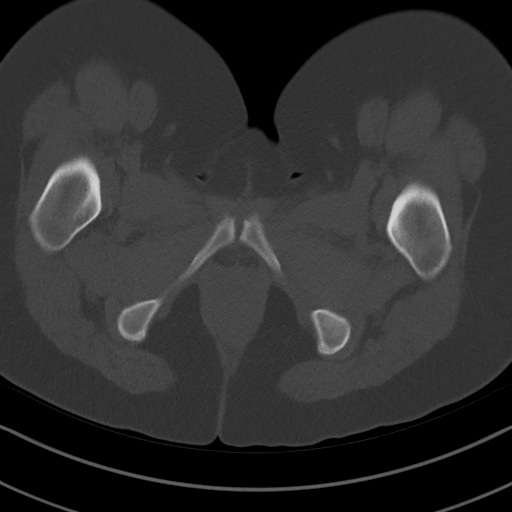
[im 14/93  soft-tissue]
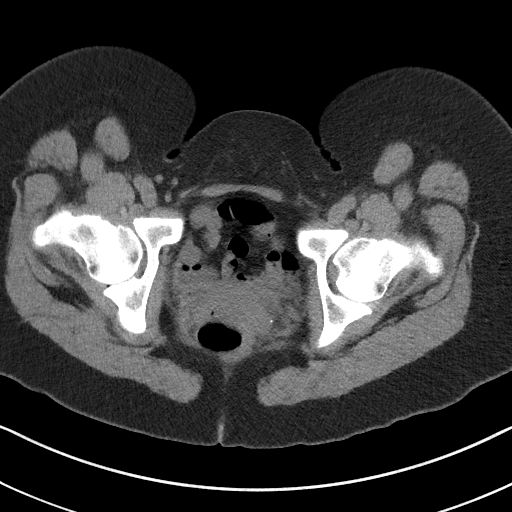
[im 21/93  soft-tissue]
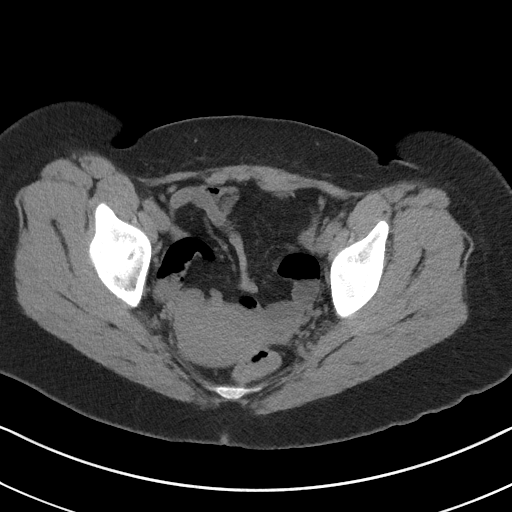
[im 28/93  soft-tissue]
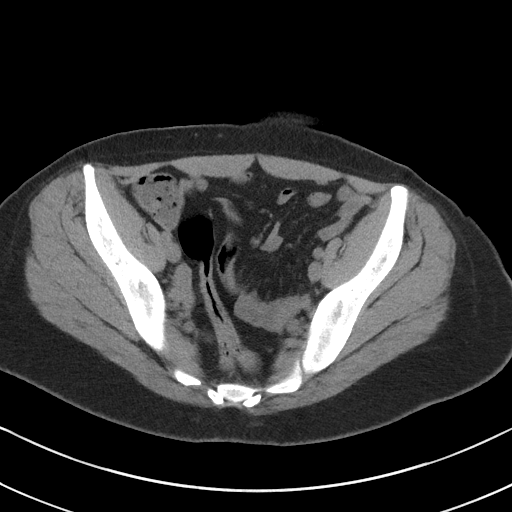
[im 35/93  soft-tissue]
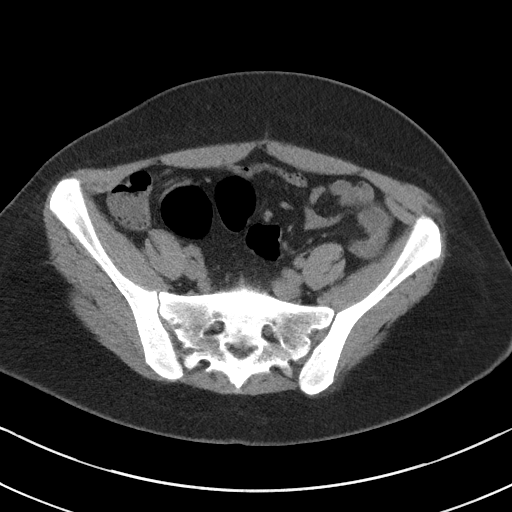
[im 41/93  soft-tissue]
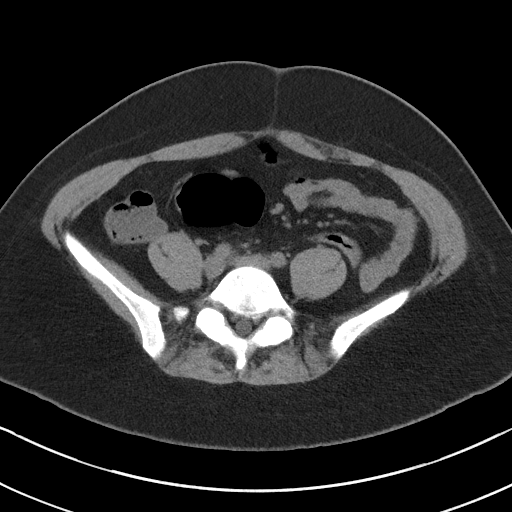
[im 48/93  soft-tissue]
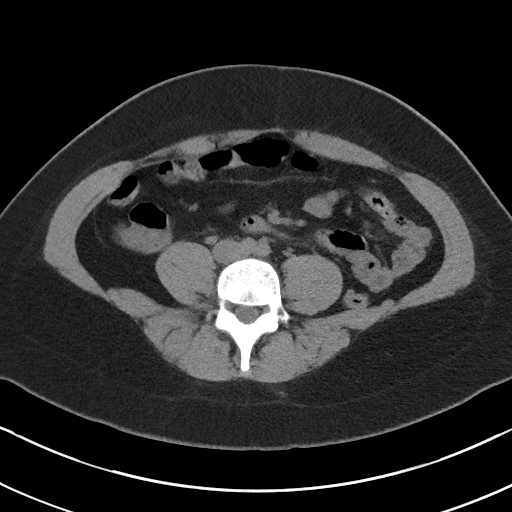
[im 55/93  soft-tissue]
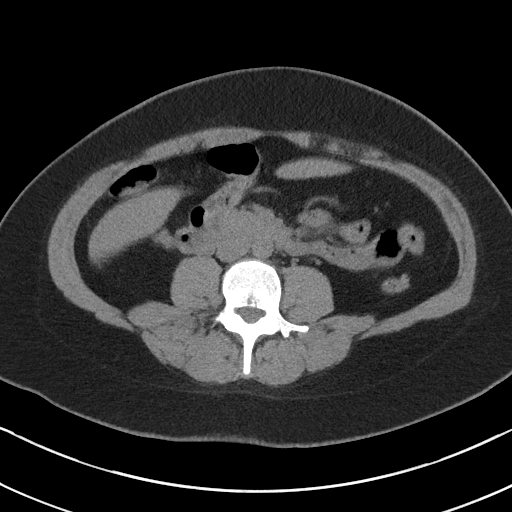
[im 62/93  soft-tissue]
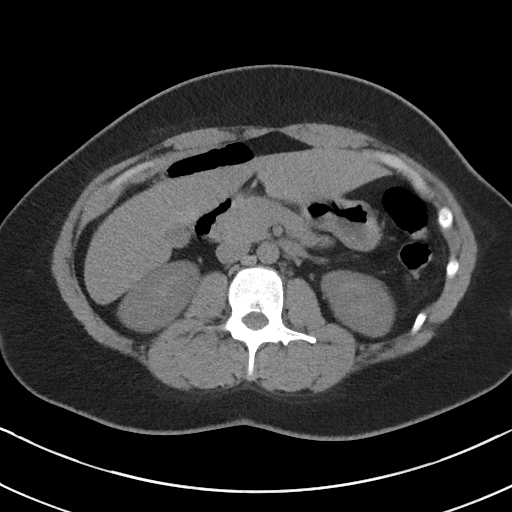
[im 62/93  bone]
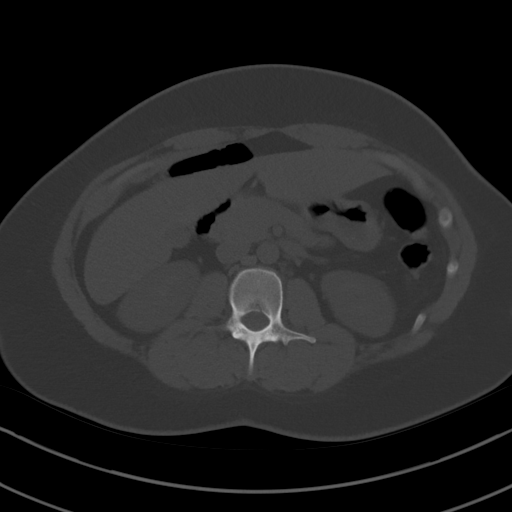
[im 69/93  soft-tissue]
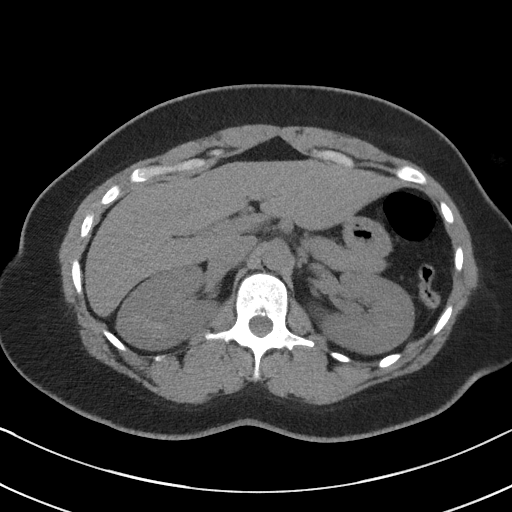
[im 75/93  soft-tissue]
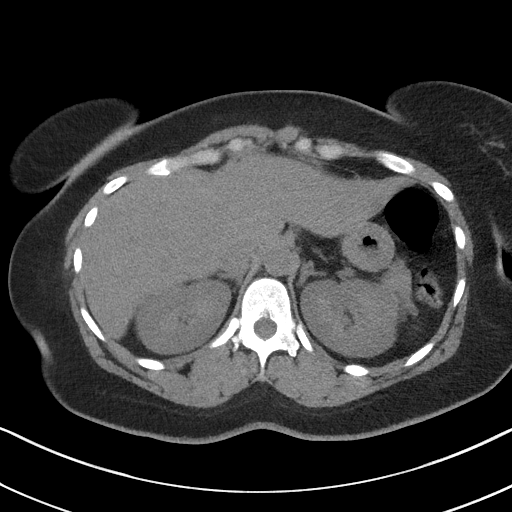
[im 82/93  soft-tissue]
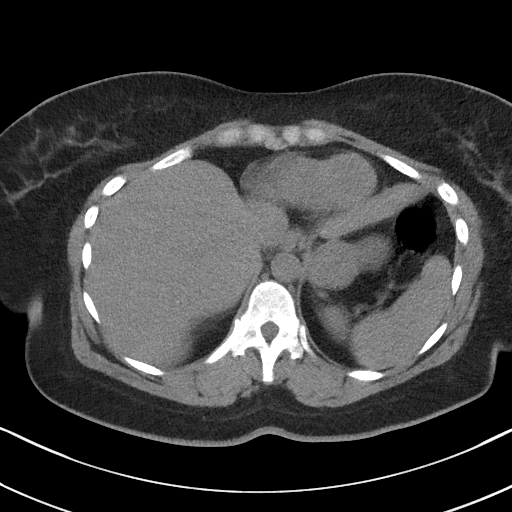
[im 89/93  soft-tissue]
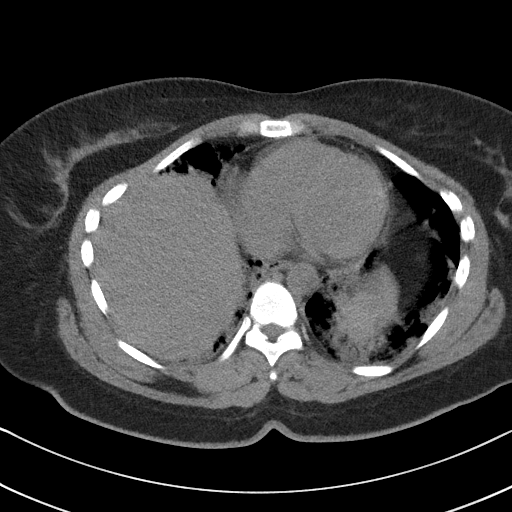

[Series 4: coronal st · coronal · 0.80mm/px · 3 of 77 slices shown]
[im 26/77  soft-tissue]
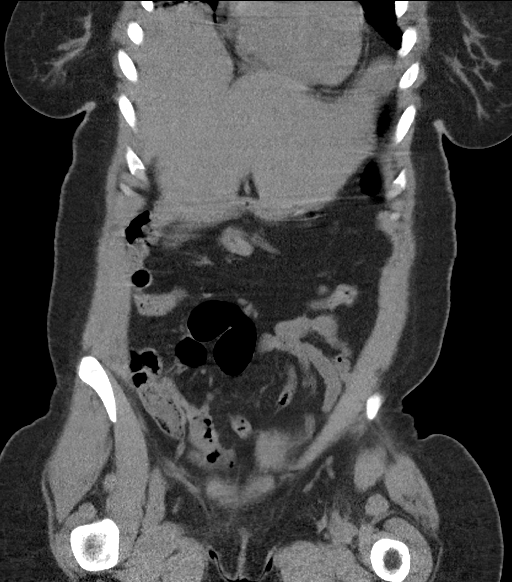
[im 34/77  soft-tissue]
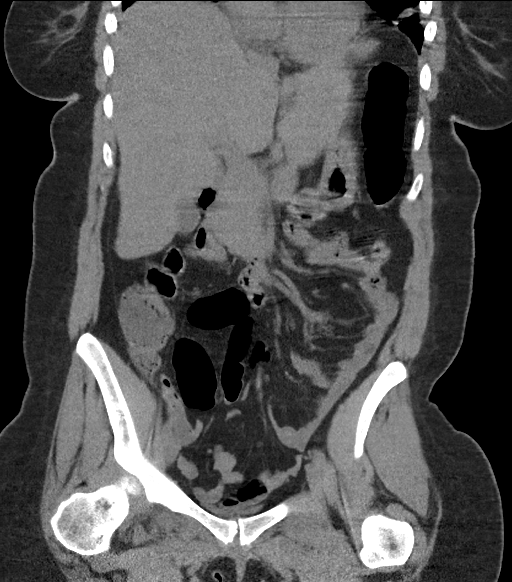
[im 43/77  soft-tissue]
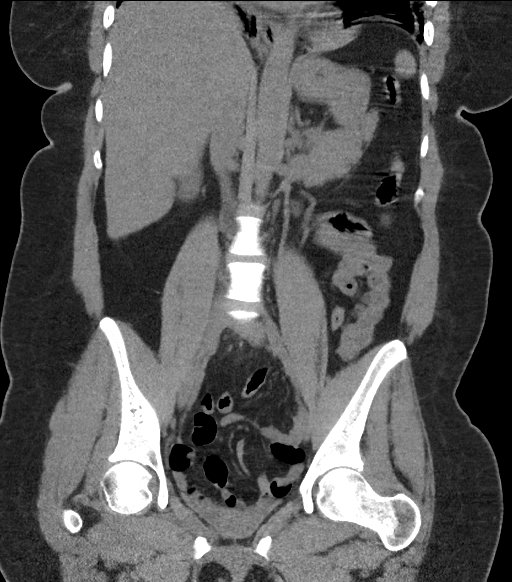

[16 of 46 positions shown; findings below may reference images not displayed]

FINDINGS: Lower chest: The visualized heart size within normal limits. No
pericardial fluid/thickening.

Multifocal patchy airspace opacities are seen throughout both lung
bases.

Hepatobiliary: Although limited due to the lack of intravenous
contrast, normal in appearance without gross focal abnormality. No
evidence of calcified gallstones or biliary ductal dilatation.

Pancreas:  Unremarkable.  No surrounding inflammatory changes.

Spleen: Normal in size. Although limited due to the lack of
intravenous contrast, normal in appearance.

Adrenals/Urinary Tract: Both adrenal glands appear normal. The
kidneys and collecting system appear normal without evidence of
urinary tract calculus or hydronephrosis. Bladder is decompressed.

Stomach/Bowel: The stomach, small bowel, and colon are normal in
appearance. No inflammatory changes or obstructive findings.
appendix is normal.

Vascular/Lymphatic: There are no enlarged abdominal or pelvic lymph
nodes. No significant gross vascular findings are present.

Reproductive: The uterus and adnexa are unremarkable.

Other: No evidence of abdominal wall mass or hernia.

Musculoskeletal: No acute or significant osseous findings.
IMPRESSION: Multifocal patchy airspace opacities within both lung bases which
could be due to multifocal pneumonia.

No renal or collecting system calculi.

No other acute intra-abdominal or pelvic pathology to patient's
symptoms

## 2022-03-02 ENCOUNTER — Emergency Department (HOSPITAL_BASED_OUTPATIENT_CLINIC_OR_DEPARTMENT_OTHER)
Admission: EM | Admit: 2022-03-02 | Discharge: 2022-03-02 | Disposition: A | Discharge status: Home / Self Care | Payer: Medicaid Other | Attending: Emergency Medicine | Admitting: Emergency Medicine

## 2022-03-02 ENCOUNTER — Encounter (HOSPITAL_BASED_OUTPATIENT_CLINIC_OR_DEPARTMENT_OTHER): Payer: Self-pay | Admitting: Emergency Medicine

## 2022-03-02 DIAGNOSIS — R21 Rash and other nonspecific skin eruption: Secondary | ICD-10-CM | POA: Insufficient documentation

## 2022-03-02 MED ORDER — FAMOTIDINE 20 MG PO TABS: 20.0000 mg | ORAL_TABLET | Freq: Every day | ORAL | 0 refills | Status: AC

## 2022-03-02 MED ORDER — DIPHENHYDRAMINE HCL 25 MG PO TABS: 25.0000 mg | ORAL_TABLET | Freq: Four times a day (QID) | ORAL | 0 refills | Status: AC | PRN

## 2022-03-02 MED ORDER — PREDNISONE 20 MG PO TABS: 20.0000 mg | ORAL_TABLET | Freq: Every day | ORAL | 0 refills | Status: AC

## 2022-03-02 NOTE — Discharge Instructions (Signed)
It was a pleasure taking care of you today.  As discussed, I am sending you home with steroids, Pepcid, and Benadryl.  Take for the next 5 days.  Follow-up with PCP if symptoms do not improve after treatment.  Return to the ER for new or worsening symptoms.

## 2022-03-02 NOTE — ED Provider Notes (Signed)
[facility] EMERGENCY DEPARTMENT Provider Note   CSN: [id number] Arrival date & time: 03/02/22  1426     History  Chief Complaint  Patient presents with   Rash    Casey Bradley is a 36 y.o. female with no significant past medical history who presents to the ED due to rash for the past few days.  Rash associated with pruritus.  She endorses new laundry detergent.  No new medications.  Denies difficulties breathing, abdominal pain, nausea, or vomiting.  No recent tick bite.  Denies fever and chills.  She has been using alcohol and witch hazel with mild improvement. Rash has spread throughout her arms and trunk.   History obtained from patient and past medical records. No interpreter used during encounter.       Home Medications Prior to Admission medications   Medication Sig Start Date End Date Taking? Authorizing Provider  diphenhydrAMINE (BENADRYL) 25 MG tablet Take 1 tablet (25 mg total) by mouth every 6 (six) hours as needed. 03/02/22  Yes Yoneko Talerico, Merla Riches, PA-C  famotidine (PEPCID) 20 MG tablet Take 1 tablet (20 mg total) by mouth daily for 5 days. 03/02/22 03/07/22 Yes Sarenity Ramaker, Merla Riches, PA-C  predniSONE (DELTASONE) 20 MG tablet Take 1 tablet (20 mg total) by mouth daily for 5 days. 03/02/22 03/07/22 Yes Ari Engelbrecht, Merla Riches, PA-C  cephALEXin (KEFLEX) 500 MG capsule Take 1 capsule (500 mg total) by mouth 4 (four) times daily. 03/14/19   Ward, Chase Picket, PA-C  levofloxacin (LEVAQUIN) 750 MG tablet Take 1 tablet (750 mg total) by mouth daily. X 7 days 02/01/20   Charlynne Pander, MD  phenazopyridine (PYRIDIUM) 200 MG tablet Take 1 tablet (200 mg total) by mouth 3 (three) times daily as needed for pain. 03/14/19   Ward, Chase Picket, PA-C      Allergies    Shellfish allergy    Review of Systems   Review of Systems  Constitutional:  Negative for chills and fever.  Respiratory:  Negative for shortness of breath.   Cardiovascular:  Negative for chest pain.   Gastrointestinal:  Negative for abdominal pain, diarrhea, nausea and vomiting.  Skin:  Positive for color change and rash.    Physical Exam Updated Vital Signs BP 123/82 (BP Location: Left Arm)   Pulse 91   Temp 97.8 F (36.6 C) (Oral)   Resp 18   Ht 5\' 5"  (1.651 m)   Wt 95 kg   LMP 02/01/2022 (Approximate)   SpO2 98%   BMI 34.85 kg/m  Physical Exam Vitals and nursing note reviewed.  Constitutional:      General: She is not in acute distress.    Appearance: She is not ill-appearing.  HENT:     Head: Normocephalic.  Eyes:     Pupils: Pupils are equal, round, and reactive to light.  Cardiovascular:     Rate and Rhythm: Normal rate and regular rhythm.     Pulses: Normal pulses.     Heart sounds: Normal heart sounds. No murmur heard.    No friction rub. No gallop.  Pulmonary:     Effort: Pulmonary effort is normal.     Breath sounds: Normal breath sounds.  Abdominal:     General: Abdomen is flat. There is no distension.     Palpations: Abdomen is soft.     Tenderness: There is no abdominal tenderness. There is no guarding or rebound.  Musculoskeletal:        General: Normal range of motion.  Cervical back: Neck supple.  Skin:    General: Skin is warm and dry.     Comments: Small erythematous papules throughout arms and trunks.  No blisters.  No pustules.  No surrounding erythema.  No drainage.  Neurological:     General: No focal deficit present.     Mental Status: She is alert.  Psychiatric:        Mood and Affect: Mood normal.        Behavior: Behavior normal.     ED Results / Procedures / Treatments   Labs (all labs ordered are listed, but only abnormal results are displayed) Labs Reviewed - No data to display  EKG None  Radiology No results found.  Procedures Procedures    Medications Ordered in ED Medications - No data to display  ED Course/ Medical Decision Making/ A&P                           Medical Decision Making  36 year old  female presents to the ED due to pruritic rash for the past few days.  Patient recently switched laundry detergents.  No new medications.  Denies abdominal pain, difficulties breathing, nausea, or vomiting.  No recent tick bites.  Upon arrival, stable vitals.  Triage noted patient to be tachycardic at 114 however, during initial evaluation, patient's heart rate in the 90s.  Patient no acute distress.  Small papular rash to upper extremities and trunk most consistent with allergic etiology. Pt has a patent airway without stridor and is handling secretions without difficulty; no angioedema. No blisters, no pustules, no warmth, no draining sinus tracts, no superficial abscesses, no bullous impetigo, no vesicles, no desquamation, no target lesions with dusky purpura or a central bulla. Not tender to touch. No concern for superimposed infection. No concern for SJS, TEN, TSS, tick borne illness, syphilis or other life-threatening condition. Will discharge home with short course of steroids, pepcid and recommend Benadryl as needed for pruritis.        Final Clinical Impression(s) / ED Diagnoses Final diagnoses:  Rash    Rx / DC Orders ED Discharge Orders          Ordered    predniSONE (DELTASONE) 20 MG tablet  Daily        03/02/22 1631    famotidine (PEPCID) 20 MG tablet  Daily        03/02/22 1631    diphenhydrAMINE (BENADRYL) 25 MG tablet  Every 6 hours PRN        03/02/22 1631              Mannie Stabile, PA-C 03/02/22 1633    Glyn Ade, MD 03/02/22 2341

## 2022-03-02 NOTE — ED Triage Notes (Signed)
Pt c/o generalized rash since Monday. Upon assessment, rash is sporadic small red bumps. Pt states the rash has not worsened and has not improved. Pt has been using alcohol and witch hazel on skin with no relief. Pt did recently switch laundry detergent.

## 2023-02-17 ENCOUNTER — Emergency Department (HOSPITAL_BASED_OUTPATIENT_CLINIC_OR_DEPARTMENT_OTHER): Payer: BC Managed Care – PPO

## 2023-02-17 ENCOUNTER — Other Ambulatory Visit: Payer: Self-pay

## 2023-02-17 ENCOUNTER — Encounter (HOSPITAL_BASED_OUTPATIENT_CLINIC_OR_DEPARTMENT_OTHER): Payer: Self-pay | Admitting: Emergency Medicine

## 2023-02-17 ENCOUNTER — Emergency Department (HOSPITAL_BASED_OUTPATIENT_CLINIC_OR_DEPARTMENT_OTHER)
Admission: EM | Admit: 2023-02-17 | Discharge: 2023-02-17 | Disposition: A | Discharge status: Home / Self Care | Payer: BC Managed Care – PPO | Attending: Emergency Medicine | Admitting: Emergency Medicine

## 2023-02-17 DIAGNOSIS — O209 Hemorrhage in early pregnancy, unspecified: Secondary | ICD-10-CM | POA: Diagnosis present

## 2023-02-17 DIAGNOSIS — Z3A01 Less than 8 weeks gestation of pregnancy: Secondary | ICD-10-CM | POA: Diagnosis not present

## 2023-02-17 DIAGNOSIS — O039 Complete or unspecified spontaneous abortion without complication: Secondary | ICD-10-CM | POA: Insufficient documentation

## 2023-02-17 LAB — CBC
HCT: 34.6 % — ABNORMAL LOW (ref 36.0–46.0)
Hemoglobin: 11.6 g/dL — ABNORMAL LOW (ref 12.0–15.0)
MCH: 24.4 pg — ABNORMAL LOW (ref 26.0–34.0)
MCHC: 33.5 g/dL (ref 30.0–36.0)
MCV: 72.7 fL — ABNORMAL LOW (ref 80.0–100.0)
Platelets: 349 10*3/uL (ref 150–400)
RBC: 4.76 MIL/uL (ref 3.87–5.11)
RDW: 16.3 % — ABNORMAL HIGH (ref 11.5–15.5)
WBC: 11.5 10*3/uL — ABNORMAL HIGH (ref 4.0–10.5)
nRBC: 0 % (ref 0.0–0.2)

## 2023-02-17 LAB — PREGNANCY, URINE: Preg Test, Ur: POSITIVE — AB

## 2023-02-17 LAB — HCG, QUANTITATIVE, PREGNANCY: hCG, Beta Chain, Quant, S: 6701 m[IU]/mL — ABNORMAL HIGH (ref <5)

## 2023-02-17 NOTE — ED Triage Notes (Signed)
Pt POV reports vaginal bleeding since Monday.  Reports appx 1 hour ago, passing a "sac."  Had [place] on Wednesday with viable fetus. Appx [redacted]w[redacted]d GA.

## 2023-02-17 NOTE — Discharge Instructions (Addendum)
As discussed, your ultrasound has indicated that you have had a miscarriage.  We have sent a referral to OB/GYN for you to follow-up with.  They will give you a call next couple days to schedule an appointment.  You can take Tylenol every 4-6 hours as needed for pain.  Get help right away if: You have severe cramps or pain in your back or abdomen. Heavy bleeding soaks through 2 large sanitary pads an hour for more than 2 hours. You become light-headed or weak. You faint. You feel sad, and your sadness takes over your thoughts. You think about hurting yourself.

## 2023-02-17 NOTE — ED Provider Notes (Cosign Needed Addendum)
[place] [facility] Provider Note   CSN: [id number] Arrival date & time: 02/17/23  1912     History  Chief Complaint  Patient presents with   Vaginal Bleeding    Casey Bradley is a 37 y.o. female who presents to the ED today for vaginal bleeding.  Reports that she is about [redacted]w[redacted]d pregnant has had increased vaginal bleeding and abdominal cramping for the past 5 days.  Patient reports that today she was going to the bathroom while at work and noticed a large clot that looked like a "sac." She reports that her cramping has improved since. Patient has been seen at [facility] ED twice earlier on in the week and was supposed to receive a call from OB/GYN yesterday to schedule an appointment.  Patient reports that she was never called.  She has had an ultrasound that confirmed intrauterine pregnancy earlier in the week. No fever or chills. No other complaints or concerns at this time.    Home Medications Prior to Admission medications   Medication Sig Start Date End Date Taking? Authorizing Provider  cephALEXin (KEFLEX) 500 MG capsule Take 1 capsule (500 mg total) by mouth 4 (four) times daily. 03/14/19   Ward, Chase Picket, PA-C  diphenhydrAMINE (BENADRYL) 25 MG tablet Take 1 tablet (25 mg total) by mouth every 6 (six) hours as needed. 03/02/22   Mannie Stabile, PA-C  famotidine (PEPCID) 20 MG tablet Take 1 tablet (20 mg total) by mouth daily for 5 days. 03/02/22 03/07/22  Mannie Stabile, PA-C  levofloxacin (LEVAQUIN) 750 MG tablet Take 1 tablet (750 mg total) by mouth daily. X 7 days 02/01/20   Charlynne Pander, MD  phenazopyridine (PYRIDIUM) 200 MG tablet Take 1 tablet (200 mg total) by mouth 3 (three) times daily as needed for pain. 03/14/19   Ward, Chase Picket, PA-C      Allergies    Shellfish allergy    Review of Systems   Review of Systems  Genitourinary:  Positive for vaginal bleeding.  All other systems reviewed and are  negative.   Physical Exam Updated Vital Signs BP (!) 151/106 (BP Location: Left Arm)   Pulse (!) 102   Temp 98 F (36.7 C) (Oral)   Resp 16   Ht 5\' 5"  (1.651 m)   Wt 97.1 kg   LMP 12/21/2022 (Exact Date)   SpO2 100%   BMI 35.61 kg/m  Physical Exam Vitals and nursing note reviewed.  Constitutional:      General: She is not in acute distress.    Appearance: Normal appearance.  HENT:     Head: Normocephalic and atraumatic.     Mouth/Throat:     Mouth: Mucous membranes are moist.  Eyes:     Conjunctiva/sclera: Conjunctivae normal.     Pupils: Pupils are equal, round, and reactive to light.  Cardiovascular:     Rate and Rhythm: Normal rate and regular rhythm.     Pulses: Normal pulses.     Heart sounds: Normal heart sounds.  Pulmonary:     Effort: Pulmonary effort is normal.     Breath sounds: Normal breath sounds.  Abdominal:     Palpations: Abdomen is soft.     Tenderness: There is abdominal tenderness.     Comments: RLQ tenderness  Skin:    General: Skin is warm and dry.     Findings: No rash.  Neurological:     General: No focal deficit present.  Mental Status: She is alert.     Sensory: No sensory deficit.     Motor: No weakness.  Psychiatric:        Mood and Affect: Mood normal.        Behavior: Behavior normal.     ED Results / Procedures / Treatments   Labs (all labs ordered are listed, but only abnormal results are displayed) Labs Reviewed  PREGNANCY, URINE - Abnormal; Notable for the following components:      Result Value   Preg Test, Ur POSITIVE (*)    All other components within normal limits  CBC - Abnormal; Notable for the following components:   WBC 11.5 (*)    Hemoglobin 11.6 (*)    HCT 34.6 (*)    MCV 72.7 (*)    MCH 24.4 (*)    RDW 16.3 (*)    All other components within normal limits  HCG, QUANTITATIVE, PREGNANCY - Abnormal; Notable for the following components:   hCG, Beta Chain, Quant, S 6,701 (*)    All other components  within normal limits    EKG None  Radiology US OB LESS THAN 14 WEEKS WITH OB TRANSVAGINAL  Result Date: 02/17/2023 CLINICAL DATA:  Vaginal bleeding x5 days. Concern for miscarriage. LMP: 12/21/2022 corresponding to an estimated gestational age of [redacted] weeks, 2 days. EXAM: OBSTETRIC <14 WK [place] AND TRANSVAGINAL OB US TECHNIQUE: Both transabdominal and transvaginal ultrasound examinations were performed for complete evaluation of the gestation as well as the maternal uterus, adnexal regions, and pelvic cul-de-sac. Transvaginal technique was performed to assess early pregnancy. COMPARISON:  Ultrasound dated 02/15/2023. FINDINGS: The uterus is retroverted. The endometrium is thickened measuring approximately 3 cm in thickness. The previously seen intrauterine pregnancy is no longer visualized consistent with miscarriage. Echogenic endometrial content likely represent blood products/clot. Correlation with HCG levels recommended to exclude the possibility of retained product of conception. The ovaries are unremarkable. No significant free fluid within the pelvis. IMPRESSION: 1. Interval passage of previously seen intrauterine pregnancy consistent with miscarriage. 2. Blood products/clot in the endometrium. Electronically Signed   By: Elgie Collard M.D.   On: 02/17/2023 20:51    Procedures Procedures: not indicated.   Medications Ordered in ED Medications - No data to display  ED Course/ Medical Decision Making/ A&P Clinical Course as of 02/17/23 2232  Fri Feb 17, 2023  2000 + VB with preg [CC]    Clinical Course User Index [CC] Glyn Ade, MD                                 Medical Decision Making Amount and/or Complexity of Data Reviewed Labs: ordered. Radiology: ordered.   This patient presents to the ED for concern of vaginal bleeding, this involves an extensive number of treatment options, and is a complaint that carries with it a high risk of complications and morbidity.    Differential diagnosis includes: IUP vs miscarriage   Comorbidities  See HPI above   Additional History  Additional history obtained from previous ED records.   Lab Tests  I ordered and personally interpreted labs.  The pertinent results include:   Negative pregnancy value of 6,701, down from 12,045 2 days ago at [facility] ED   Imaging Studies  I ordered imaging studies including OB TVUS I independently visualized and interpreted imaging which showed: Interval passage of previous seen IUP consistent with miscarriage.  Blood products/clot in endometrium. I agree  with the radiologist interpretation   Problem List / ED Course / Critical Interventions / Medication Management  Vaginal bleeding and cramping [redacted] weeks pregnant I have reviewed the patients home medicines and have made adjustments as needed   Social Determinants of Health  Access to healthcare   Test / Admission - Considered  Informed patient of results. She is hemodynamically stable and safe for discharge home. Ambulatory referral for OB/GYN provided. Return precautions given.       Final Clinical Impression(s) / ED Diagnoses Final diagnoses:  Miscarriage    Rx / DC Orders ED Discharge Orders          Ordered    Ambulatory referral to Obstetrics / Gynecology        02/17/23 2152              Maxwell Marion, PA-C 02/17/23 2232    Maxwell Marion, PA-C 02/17/23 1610    Glyn Ade, MD 02/20/23 1742
